# Patient Record
Sex: Male | Born: 1995 | Race: White | Hispanic: No | Marital: Single | Smoking: Never smoker
Health system: Southern US, Community
[De-identification: ages and names within clinical notes are randomized; demographics above are authoritative.]

## PROBLEM LIST (undated history)

## (undated) HISTORY — PX: HERNIA REPAIR: SHX51

---
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
## 2017-07-11 ENCOUNTER — Other Ambulatory Visit: Payer: Self-pay | Admitting: Family Medicine

## 2017-07-11 DIAGNOSIS — N5089 Other specified disorders of the male genital organs: Secondary | ICD-10-CM

## 2017-07-15 ENCOUNTER — Ambulatory Visit
Admission: RE | Admit: 2017-07-15 | Discharge: 2017-07-15 | Disposition: A | Discharge status: Home / Self Care | Payer: PRIVATE HEALTH INSURANCE | Source: Ambulatory Visit | Attending: Family Medicine | Admitting: Family Medicine

## 2017-07-15 ENCOUNTER — Other Ambulatory Visit: Payer: PRIVATE HEALTH INSURANCE

## 2017-07-15 DIAGNOSIS — N5089 Other specified disorders of the male genital organs: Secondary | ICD-10-CM

## 2018-01-03 IMAGING — US US SCROTUM W/ DOPPLER COMPLETE
1 series · 13 of 25 positions shown · non-contrast
Comparison: No recent prior.

CLINICAL DATA: Right testicular lump.

EXAM:
SCROTAL ULTRASOUND
DOPPLER ULTRASOUND OF THE TESTICLES
TECHNIQUE: Complete ultrasound examination of the testicles, epididymis, and
other scrotal structures was performed. Color and spectral Doppler
ultrasound were also utilized to evaluate blood flow to the
testicles.

[Series 1: us scrotum w/ doppler complete · 0.05mm/px · 13 of 68 slices shown]
[im 1/68]
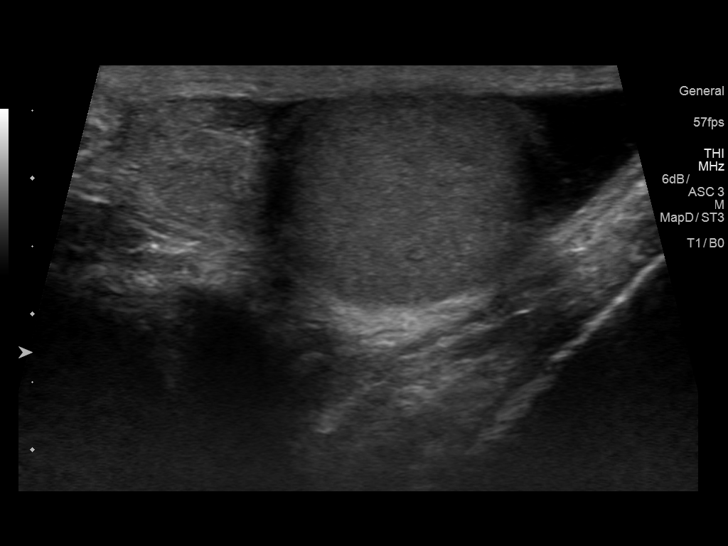
[im 6/68]
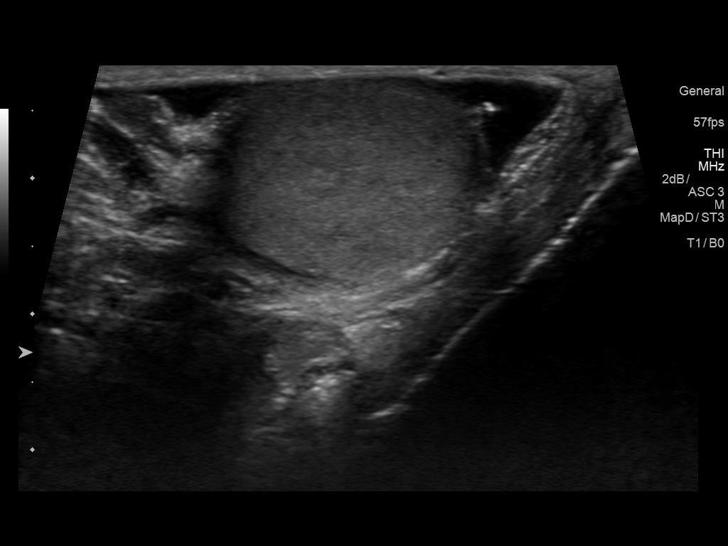
[im 12/68]
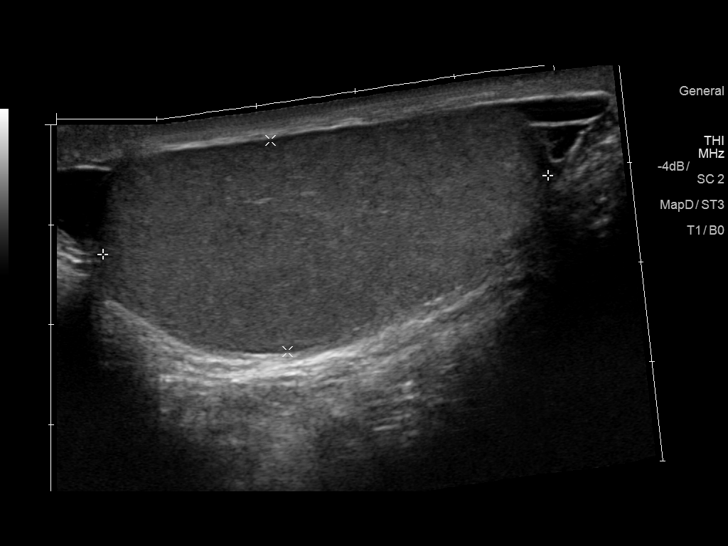
[im 17/68]
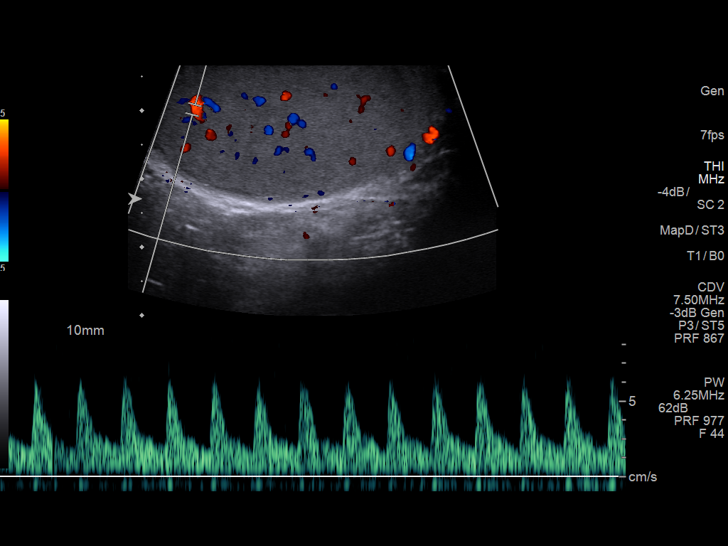
[im 23/68]
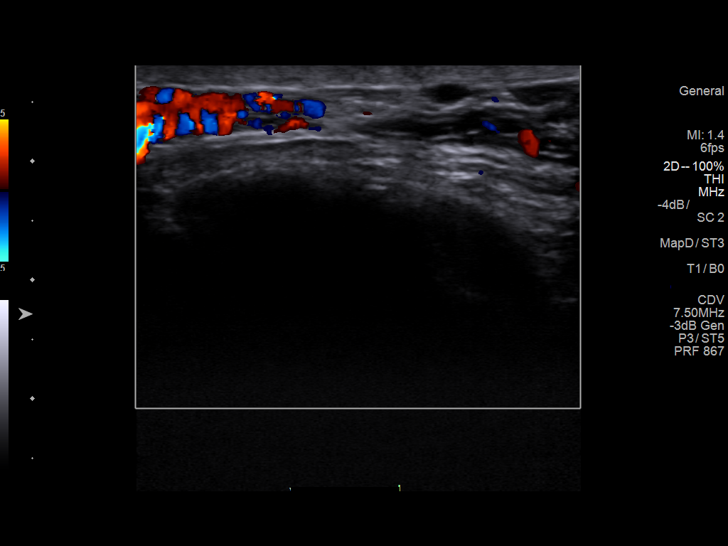
[im 28/68]
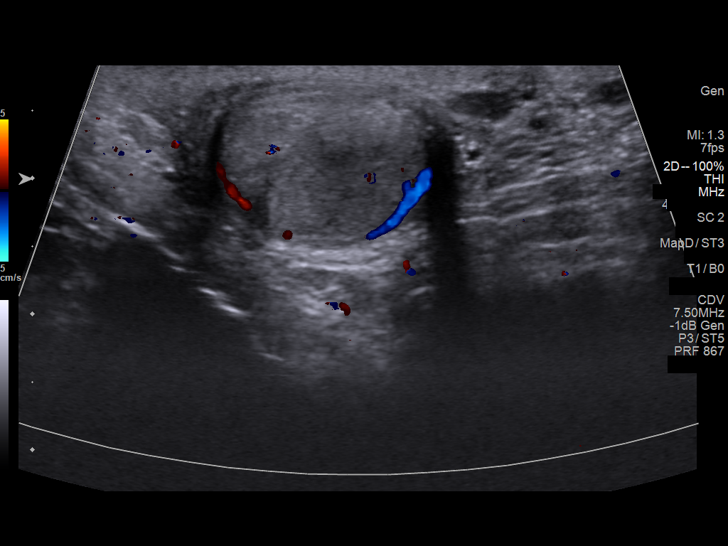
[im 34/68]
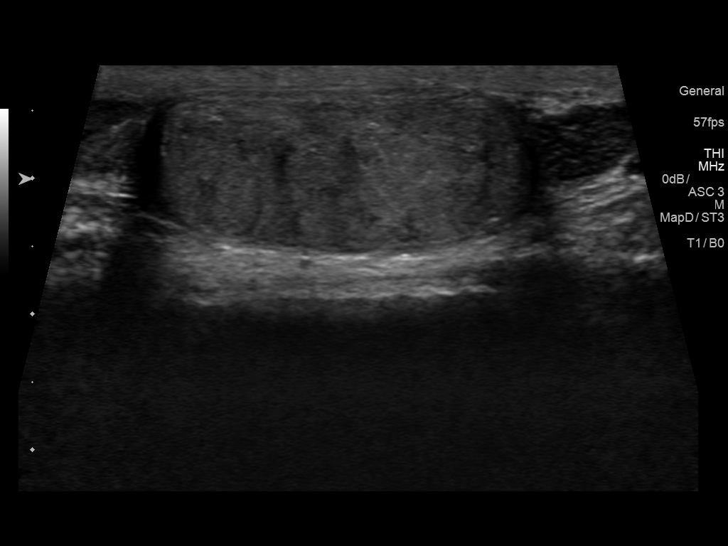
[im 40/68]
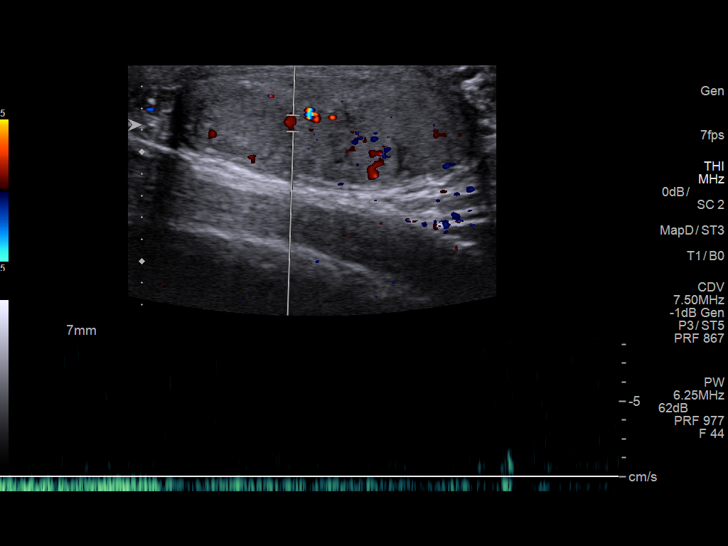
[im 45/68]
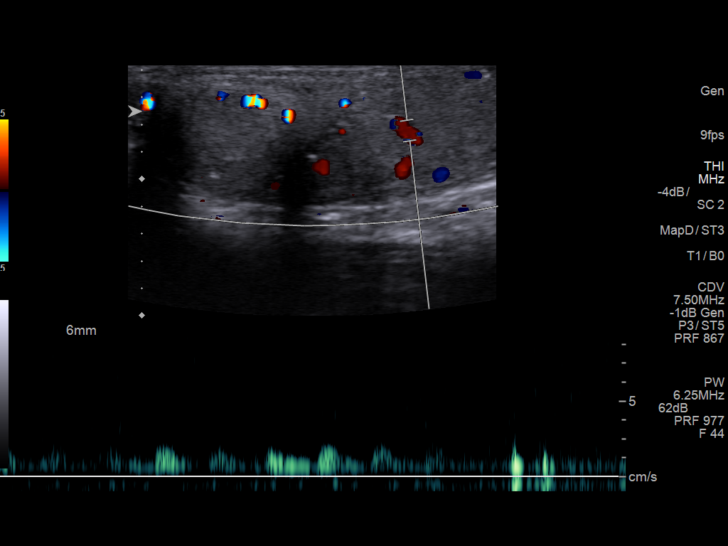
[im 51/68]
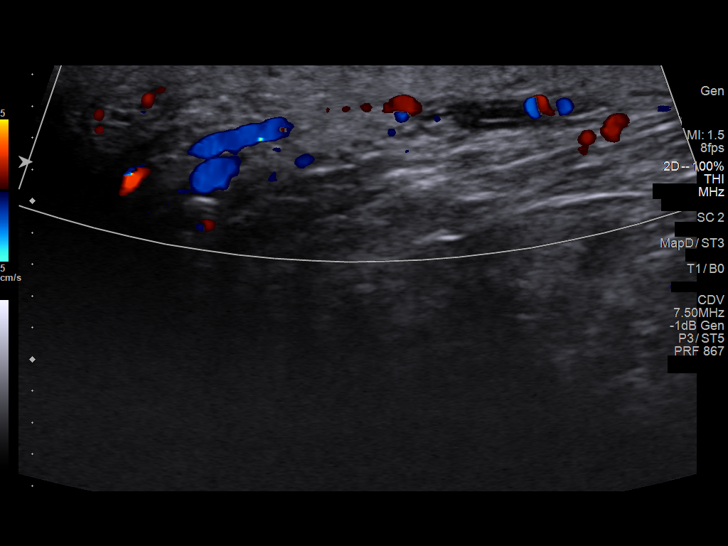
[im 56/68]
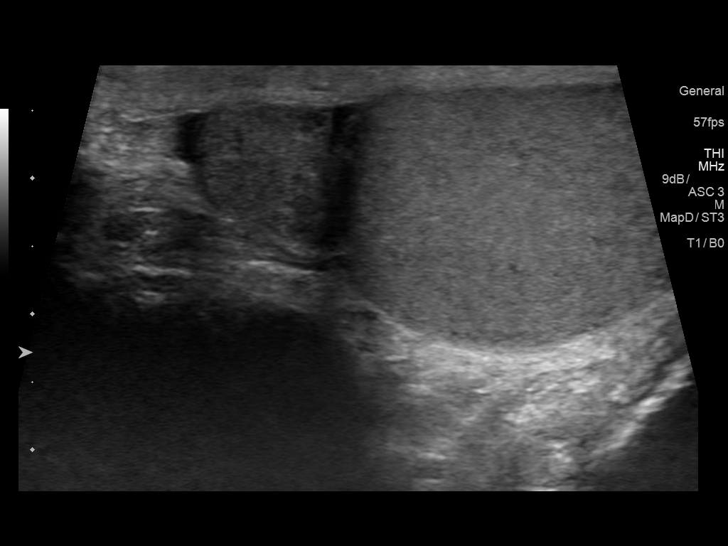
[im 62/68]
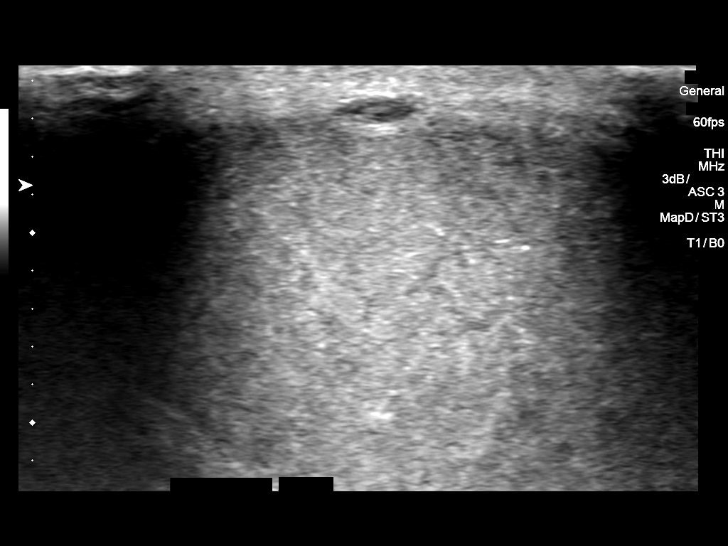
[im 68/68]
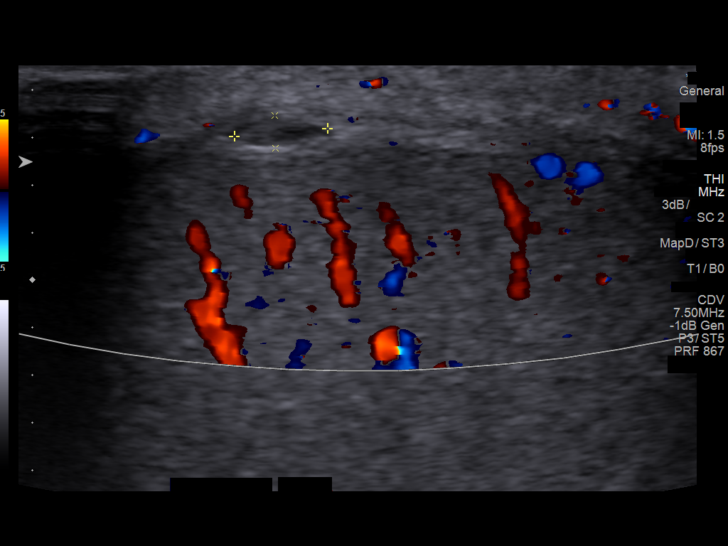

[13 of 25 positions shown; findings below may reference images not displayed]

FINDINGS: Right testicle

Measurements: 4.5 x 2.1 x 3.3 cm. No mass or microlithiasis
visualized. In the region of palpable abnormality is a small 4 mm
hypoechoic focus that appears to be within the scrotum just anterior
to the testicle. Follow-up exam can be obtained to demonstrate
stability and/or resolution.

Left testicle

Measurements: 2.8 x 1.2 x 1.6 cm. No mass or microlithiasis
visualized.

Right epididymis:  Normal in size and appearance.

Left epididymis:  Normal in size and appearance.

Hydrocele:  Small right hydrocele.

Varicocele:  None visualized.

Pulsed Doppler interrogation of both testes demonstrates normal low
resistance arterial and venous waveforms bilaterally.
IMPRESSION: 1. No evidence of testicular mass. Palpable area represents a small
hypoechoic focus and appears to be within the scrotum. Follow-up
exam to demonstrate stability and/or resolution suggested .

2. Small right hydrocele.

3. Left testicular atrophy.

## 2019-07-18 ENCOUNTER — Other Ambulatory Visit: Payer: Self-pay

## 2019-07-18 DIAGNOSIS — Z20822 Contact with and (suspected) exposure to covid-19: Secondary | ICD-10-CM

## 2019-07-19 LAB — NOVEL CORONAVIRUS, NAA: SARS-CoV-2, NAA: NOT DETECTED

## 2019-07-23 ENCOUNTER — Other Ambulatory Visit: Payer: Self-pay

## 2019-07-23 DIAGNOSIS — Z20822 Contact with and (suspected) exposure to covid-19: Secondary | ICD-10-CM

## 2019-07-25 ENCOUNTER — Other Ambulatory Visit: Payer: Self-pay

## 2019-07-25 DIAGNOSIS — Z20822 Contact with and (suspected) exposure to covid-19: Secondary | ICD-10-CM

## 2019-07-25 LAB — NOVEL CORONAVIRUS, NAA: SARS-CoV-2, NAA: NOT DETECTED

## 2019-07-27 LAB — NOVEL CORONAVIRUS, NAA: SARS-CoV-2, NAA: NOT DETECTED

## 2019-07-30 ENCOUNTER — Other Ambulatory Visit: Payer: Self-pay

## 2019-07-30 DIAGNOSIS — U071 COVID-19: Secondary | ICD-10-CM

## 2019-07-31 LAB — NOVEL CORONAVIRUS, NAA: SARS-CoV-2, NAA: DETECTED — AB

## 2019-08-03 ENCOUNTER — Ambulatory Visit (INDEPENDENT_AMBULATORY_CARE_PROVIDER_SITE_OTHER)
Admission: RE | Admit: 2019-08-03 | Discharge: 2019-08-03 | Disposition: A | Discharge status: Home / Self Care | Payer: BC Managed Care – PPO | Source: Ambulatory Visit

## 2019-08-03 DIAGNOSIS — R439 Unspecified disturbances of smell and taste: Secondary | ICD-10-CM

## 2019-08-03 DIAGNOSIS — U071 COVID-19: Secondary | ICD-10-CM

## 2019-08-03 NOTE — ED Provider Notes (Signed)
Virtual Visit via Video Note:  Tristan Poole  initiated request for Telemedicine visit with [facility] team. I connected with Tristan Poole  on 08/03/2019 at 10:24 AM  for a synchronized telemedicine visit using a video enabled HIPPA compliant telemedicine application. I verified that I am speaking with Tristan Poole  using two identifiers. Bristyl Mclees Jodell Cipro, PA-C  was physically located in a [facility] Urgent care site and Tristan Poole was located at a different location.   The limitations of evaluation and management by telemedicine as well as the availability of in-person appointments were discussed. Patient was informed that he  may incur a bill ( including co-pay) for this virtual visit encounter. Tristan Poole  expressed understanding and gave verbal consent to proceed with virtual visit.     History of Present Illness:Tristan Poole  is a 23 y.o. male presents with positive COVID-19 test.  Patient requesting more information on when to end quarantine.  He had his first covered test 07/18/2019 after positive exposure with negative results.  States on 07/20/2019, he hung out with friends, and had second positive COVID exposure.  His was tested again on 07/23/2019 with negative results.  He started having fever of 101, body aches on 07/24/2019.  He had a third COVID test 07/25/2019 with negative results.  States he then developed congestion, loss of taste and smell, with fourth COVID test on 07/30/2019, which came back positive.  States since starting symptoms on 07/24/2019, since symptoms have been improving.  He has not had a fever for the past 4 days.  He denies any shortness of breath.  States he is regaining some smell, but still has loss of taste.  He has been working from home.   No past medical history on file.  No Known Allergies    Observations/Objective: General: Well appearing, nontoxic, no acute distress. Sitting comfortably. Head: Normocephalic, atraumatic Eye: No  conjunctival injection, eyelid swelling. EOMI ENT: Mucus membranes moist, no lip cracking. No obvious nasal drainage. Pulm: Speaking in full sentences without difficulty. Normal effort. No respiratory distress, accessory muscle use. Neuro: Normal mental status. Alert and oriented x 3.   Assessment and Plan: Discussed CDC recommendations discontinue quarantine is 10 days since symptom onset with greater than 72 hours of no fever with antipyretics, and improving symptoms.  Patient will meet this criteria tomorrow.  Will provide work note, can return to work 08/06/2019.  Discussed with patient, COVID test can remain positive up to 1 to 2 months.  Return precautions given.  Patient expresses understanding and agrees to plan.  Follow Up Instructions:    I discussed the assessment and treatment plan with the patient. The patient was provided an opportunity to ask questions and all were answered. The patient agreed with the plan and demonstrated an understanding of the instructions.   The patient was advised to call back or seek an in-person evaluation if the symptoms worsen or if the condition fails to improve as anticipated.  I provided 20 minutes of non-face-to-face time during this encounter.    Ok Edwards, PA-C  08/03/2019 10:24 AM          Ok Edwards, PA-C 08/03/19 1026

## 2019-08-03 NOTE — Discharge Instructions (Signed)
You will be able to end quarantine and return to work 08/06/2019 as long as symptoms are improving and no temperature >100.4 for at least 3 days without tylenol/motrin. Continue to monitor symptoms. If experiencing shortness of breath, go to the ED for further evaluation needed.

## 2020-10-09 DIAGNOSIS — I10 Essential (primary) hypertension: Secondary | ICD-10-CM | POA: Insufficient documentation

## 2021-01-02 ENCOUNTER — Emergency Department (HOSPITAL_COMMUNITY): Payer: BC Managed Care – PPO

## 2021-01-02 ENCOUNTER — Other Ambulatory Visit: Payer: Self-pay

## 2021-01-02 ENCOUNTER — Encounter (HOSPITAL_COMMUNITY): Payer: Self-pay | Admitting: Emergency Medicine

## 2021-01-02 ENCOUNTER — Emergency Department (HOSPITAL_COMMUNITY)
Admission: EM | Admit: 2021-01-02 | Discharge: 2021-01-03 | Disposition: A | Discharge status: Home / Self Care | Payer: BC Managed Care – PPO | Attending: Emergency Medicine | Admitting: Emergency Medicine

## 2021-01-02 DIAGNOSIS — F419 Anxiety disorder, unspecified: Secondary | ICD-10-CM | POA: Diagnosis not present

## 2021-01-02 DIAGNOSIS — R000 Tachycardia, unspecified: Secondary | ICD-10-CM | POA: Insufficient documentation

## 2021-01-02 DIAGNOSIS — R002 Palpitations: Secondary | ICD-10-CM | POA: Insufficient documentation

## 2021-01-02 LAB — CBC
HCT: 45.1 % (ref 39.0–52.0)
Hemoglobin: 16 g/dL (ref 13.0–17.0)
MCH: 31.3 pg (ref 26.0–34.0)
MCHC: 35.5 g/dL (ref 30.0–36.0)
MCV: 88.1 fL (ref 80.0–100.0)
Platelets: 255 10*3/uL (ref 150–400)
RBC: 5.12 MIL/uL (ref 4.22–5.81)
RDW: 11.8 % (ref 11.5–15.5)
WBC: 8 10*3/uL (ref 4.0–10.5)
nRBC: 0 % (ref 0.0–0.2)

## 2021-01-02 LAB — BASIC METABOLIC PANEL
Anion gap: 13 (ref 5–15)
BUN: 15 mg/dL (ref 6–20)
CO2: 22 mmol/L (ref 22–32)
Calcium: 9.7 mg/dL (ref 8.9–10.3)
Chloride: 102 mmol/L (ref 98–111)
Creatinine, Ser: 0.96 mg/dL (ref 0.61–1.24)
GFR, Estimated: >60 mL/min (ref >60)
Glucose, Bld: 109 mg/dL — ABNORMAL HIGH (ref 70–99)
Potassium: 3.3 mmol/L — ABNORMAL LOW (ref 3.5–5.1)
Sodium: 137 mmol/L (ref 135–145)

## 2021-01-02 LAB — TROPONIN I (HIGH SENSITIVITY)
Troponin I (High Sensitivity): <2 ng/L (ref <18)
Troponin I (High Sensitivity): <2 ng/L (ref <18)

## 2021-01-02 NOTE — ED Triage Notes (Signed)
Patient here with palpitations, states that he has been having them for awhile, has had Holter monitors, stress tests and nothing has been found.  Patient having some anxiety due to the palpitations.  Patient denies any chest pain.  No nausea or vomiting.

## 2021-01-03 MED ORDER — LORAZEPAM 1 MG PO TABS
1.0000 mg | ORAL_TABLET | Freq: Once | ORAL | Status: AC
  Administered 2021-01-03: 1 mg via ORAL
  Filled 2021-01-03: qty 1

## 2021-01-03 MED ORDER — PROPRANOLOL HCL 10 MG PO TABS
10.0000 mg | ORAL_TABLET | Freq: Once | ORAL | Status: AC
  Administered 2021-01-03: 10 mg via ORAL
  Filled 2021-01-03: qty 1

## 2021-01-03 MED ORDER — PROPRANOLOL HCL 10 MG PO TABS: 10.0000 mg | ORAL_TABLET | Freq: Two times a day (BID) | ORAL | 0 refills | Status: DC | PRN

## 2021-01-03 MED ORDER — POTASSIUM CHLORIDE CRYS ER 20 MEQ PO TBCR
40.0000 meq | EXTENDED_RELEASE_TABLET | Freq: Once | ORAL | Status: AC
  Administered 2021-01-03: 40 meq via ORAL
  Filled 2021-01-03: qty 2

## 2021-01-03 NOTE — ED Provider Notes (Signed)
[facility] EMERGENCY DEPARTMENT Provider Note   CSN: [id number] Arrival date & time: 01/02/21  1953     History Chief Complaint  Patient presents with  . Palpitations    Tristan Poole is a 25 y.o. male.  25 year old male here with recurrent episode of palpitations.  Patient states he has multiple times in the past.  He has had multiple Holter monitors and cardiology/PCP visits to evaluate the same.  Patient states that all of these have been to no avail.  He states he does use vape pens and has nicotine in them is trying to quit.  He also notices that symptoms can happen more commonly when he is hung over or dehydrated.  He states that he has anxiety around episodes and is going to the point where the anxiety happens any time he thinks about it.  States he feels a flutter without any chest pain or shortness of breath and goes off.  No illicit drug use.   Palpitations      No past medical history on file.  There are no problems to display for this patient.   Past Surgical History:  Procedure Laterality Date  . HERNIA REPAIR         History reviewed. No pertinent family history.  Social History   Tobacco Use  . Smoking status: Never Smoker  . Smokeless tobacco: Never Used  Substance Use Topics  . Alcohol use: Yes    Comment: once a month  . Drug use: No    Home Medications Prior to Admission medications   Medication Sig Start Date End Date Taking? Authorizing Provider  amLODipine (NORVASC) 10 MG tablet Take 10 mg by mouth daily. 12/09/20  Yes [provider]  Ascorbic Acid (VITAMIN C) 1000 MG tablet Take 1,000 mg by mouth daily.   Yes [provider]  pantoprazole (PROTONIX) 40 MG tablet Take 40 mg by mouth daily. 12/03/20  Yes [provider]  propranolol (INDERAL) 10 MG tablet Take 1 tablet (10 mg total) by mouth 2 (two) times daily as needed. 01/03/21  Yes Tarshia Kot, Barbara Cower, MD    Allergies    Patient has no known  allergies.  Review of Systems   Review of Systems  Cardiovascular: Positive for palpitations.  All other systems reviewed and are negative.   Physical Exam Updated Vital Signs BP 123/71   Pulse (!) 119   Temp 97.7 F (36.5 C) (Oral)   Resp 20   SpO2 96%   Physical Exam Vitals and nursing note reviewed.  Constitutional:      Appearance: He is well-developed and well-nourished.  HENT:     Head: Normocephalic and atraumatic.     Nose: Nose normal. No congestion or rhinorrhea.     Mouth/Throat:     Mouth: Mucous membranes are moist.     Pharynx: Oropharynx is clear.  Eyes:     Pupils: Pupils are equal, round, and reactive to light.  Cardiovascular:     Rate and Rhythm: Tachycardia present.     Comments: It appears that the patient's heart rate is in the low 100s but as I talked to him about his palpitations and his anxiety his heart rate gets as high as 138.  As he takes some deep breaths and relaxes his heart rate improved to the low 100s again.  It is regular. Pulmonary:     Effort: Pulmonary effort is normal. No respiratory distress.  Abdominal:     General: Abdomen is  flat. There is no distension.  Musculoskeletal:        General: Normal range of motion.     Cervical back: Normal range of motion.  Skin:    General: Skin is warm and dry.  Neurological:     General: No focal deficit present.     Mental Status: He is alert.     ED Results / Procedures / Treatments   Labs (all labs ordered are listed, but only abnormal results are displayed) Labs Reviewed  BASIC METABOLIC PANEL - Abnormal; Notable for the following components:      Result Value   Potassium 3.3 (*)    Glucose, Bld 109 (*)    All other components within normal limits  CBC  TROPONIN I (HIGH SENSITIVITY)  TROPONIN I (HIGH SENSITIVITY)    EKG EKG Interpretation  Date/Time:  Friday January 02 2021 20:10:09 EST Ventricular Rate:  126 PR Interval:  158 QRS Duration: 96 QT Interval:  312 QTC  Calculation: 451 R Axis:   92 Text Interpretation: Sinus tachycardia Rightward axis Septal infarct , age undetermined ST & T wave abnormality, consider inferior ischemia Abnormal ECG No old tracing to compare Confirmed by Marily Memos [phone]) on 01/03/2021 12:43:31 AM   EKG Interpretation  Date/Time:  Saturday January 03 2021 01:31:10 EST Ventricular Rate:  131 PR Interval:  158 QRS Duration: 102 QT Interval:  295 QTC Calculation: 436 R Axis:   90 Text Interpretation: 0Sinus tachycardia Borderline right axis deviation Nonspecific repol abnormality, diffuse leads Confirmed by Marily Memos [id number]) on 01/03/2021 1:59:41 AM        Radiology DG Chest 2 View  Result Date: 01/02/2021 CLINICAL DATA:  Tachycardia and heart palpitations. EXAM: CHEST - 2 VIEW COMPARISON:  12/30/2011 FINDINGS: The cardiomediastinal contours are normal. The lungs are clear. Pulmonary vasculature is normal. No consolidation, pleural effusion, or pneumothorax. No acute osseous abnormalities are seen. IMPRESSION: Negative radiographs of the chest. Electronically Signed   By: Narda Rutherford M.D.   On: 01/02/2021 20:31    Procedures Procedures   Medications Ordered in ED Medications  LORazepam (ATIVAN) tablet 1 mg (1 mg Oral Given 01/03/21 0302)  propranolol (INDERAL) tablet 10 mg (10 mg Oral Given 01/03/21 0302)  potassium chloride SA (KLOR-CON) CR tablet 40 mEq (40 mEq Oral Given 01/03/21 0302)    ED Course  I have reviewed the triage vital signs and the nursing notes.  Pertinent labs & imaging results that were available during my care of the patient were reviewed by me and considered in my medical decision making (see chart for details).    MDM Rules/Calculators/A&P                         Slightly low K but not likely related. Sinus tach on monitor. Sure sounds like anxiety especially in the setting of multiple cardiac workups and consultations with relatively normal labs here. Will give ativan and initiate PRN  propranolol for ysmptoms. Cardiology follow up again on the 10th already scheduled.  Significant improvement with medications here. Will rx for PRN propranolol pending cardiology follow up.   Final Clinical Impression(s) / ED Diagnoses Final diagnoses:  Palpitations  Anxiety    Rx / DC Orders ED Discharge Orders         Ordered    propranolol (INDERAL) 10 MG tablet  2 times daily PRN        01/03/21 0416  Rudell Ortman, Barbara Cower, MD 01/03/21 [phone]

## 2021-01-08 DIAGNOSIS — F419 Anxiety disorder, unspecified: Secondary | ICD-10-CM | POA: Insufficient documentation

## 2021-04-01 ENCOUNTER — Encounter (HOSPITAL_COMMUNITY): Payer: Self-pay

## 2021-04-01 ENCOUNTER — Other Ambulatory Visit: Payer: Self-pay

## 2021-04-01 ENCOUNTER — Ambulatory Visit (HOSPITAL_COMMUNITY)
Admission: EM | Admit: 2021-04-01 | Discharge: 2021-04-01 | Disposition: A | Discharge status: Home / Self Care | Payer: BC Managed Care – PPO | Attending: Family Medicine | Admitting: Family Medicine

## 2021-04-01 DIAGNOSIS — M25561 Pain in right knee: Secondary | ICD-10-CM | POA: Diagnosis not present

## 2021-04-01 DIAGNOSIS — S81011A Laceration without foreign body, right knee, initial encounter: Secondary | ICD-10-CM | POA: Diagnosis not present

## 2021-04-01 NOTE — ED Triage Notes (Signed)
Pt in with c/o right knee injury that occurred when he mis stepped and fell on the tailgate of his truck

## 2021-04-04 NOTE — ED Provider Notes (Signed)
  [facility] CARE CENTER   [id number] 04/01/21 Arrival Time: 1743  ASSESSMENT & PLAN:  1. Acute pain of right knee   2. Laceration of right knee, initial encounter    Procedure: Verbal consent obtained. Patient provided with risks and alternatives to the procedure. Wound copiously irrigated with NS then cleansed with betadine. Local anesthesia: Lidocaine 1% without epinephrine. Wound carefully explored. No foreign body, tendon injury, or nonviable tissue were noted. Using sterile technique, 2 interrupted 4-0 Prolene sutures were placed in addition to 2 vertical mattress 4-0 Prolene sutures to reapproximate the wound. Procedure tolerated well. No complications. Minimal bleeding. Advised to look for and return for any signs of infection such as redness, swelling, discharge, or worsening pain. Return for suture removal in 7-10 days.  See AVS for discharge information.  Reviewed expectations re: course of current medical issues. Questions answered. Outlined signs and symptoms indicating need for more acute intervention. Patient verbalized understanding. After Visit Summary given.   SUBJECTIVE:  Tristan Poole is a 25 y.o. male who presents with a laceration of his R knee; hit on truck tailgate. Mod bleeding; controlled. Able to bear weight with minimal discomfort.  Td UTD: Yes.  ROS: As per HPI. Health Maintenance Due  Topic Date Due  . COVID-19 Vaccine (1) Never done  . HPV VACCINES (1 - Male 2-dose series) Never done  . HIV Screening  Never done  . Hepatitis C Screening  Never done  . TETANUS/TDAP  Never done    OBJECTIVE:  Vitals:   04/01/21 1809  BP: (!) 153/84  Pulse: (!) 109  Resp: 18  Temp: 98.7 F (37.1 C)  TempSrc: Oral  SpO2: 97%    Slight tachycardia noted; reports anxiety. General appearance: alert; no distress Skin: linear laceration of R anterior knee; size: approx 1.5 cm; clean wound edges, no foreign bodies; without active bleeding Psychological: alert  and cooperative; normal mood and affect   Labs Reviewed - No data to display  No results found.  No Known Allergies  History reviewed. No pertinent past medical history. Social History   Socioeconomic History  . Marital status: Single    Spouse name: Not on file  . Number of children: Not on file  . Years of education: Not on file  . Highest education level: Not on file  Occupational History  . Not on file  Tobacco Use  . Smoking status: Never Smoker  . Smokeless tobacco: Never Used  Substance and Sexual Activity  . Alcohol use: Yes    Comment: once a month  . Drug use: No  . Sexual activity: Never  Other Topics Concern  . Not on file  Social History Narrative  . Not on file   Social Determinants of Health   Financial Resource Strain: Not on file  Food Insecurity: Not on file  Transportation Needs: Not on file  Physical Activity: Not on file  Stress: Not on file  Social Connections: Not on file         Mardella Layman, MD 04/04/21 1333

## 2022-10-27 IMAGING — DX DG CHEST 2V
2 series · 2 of 2 positions shown · non-contrast
Comparison: 12/30/2011

CLINICAL DATA: Tachycardia and heart palpitations.

EXAM:
CHEST - 2 VIEW

[w chest pa]
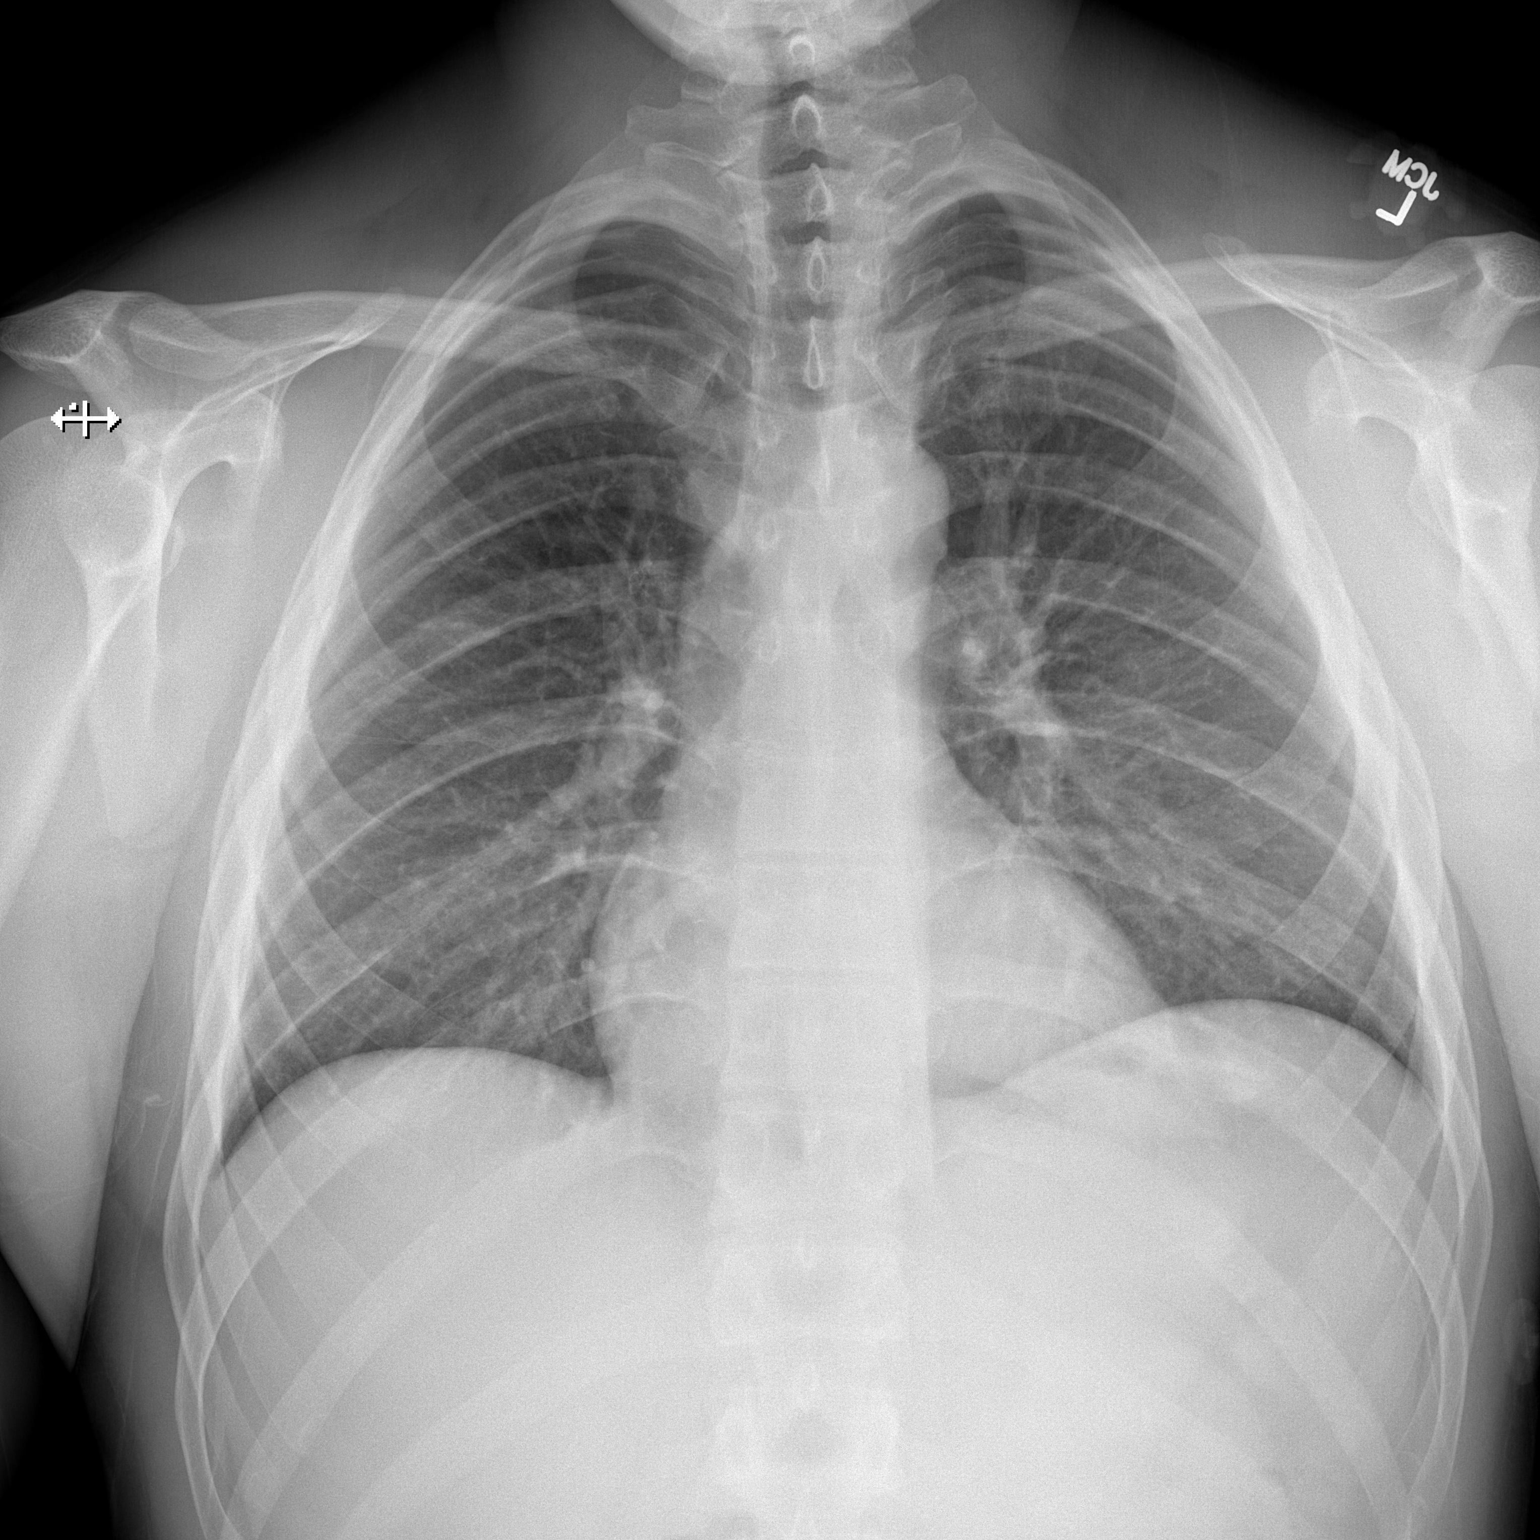

[w chest lat]
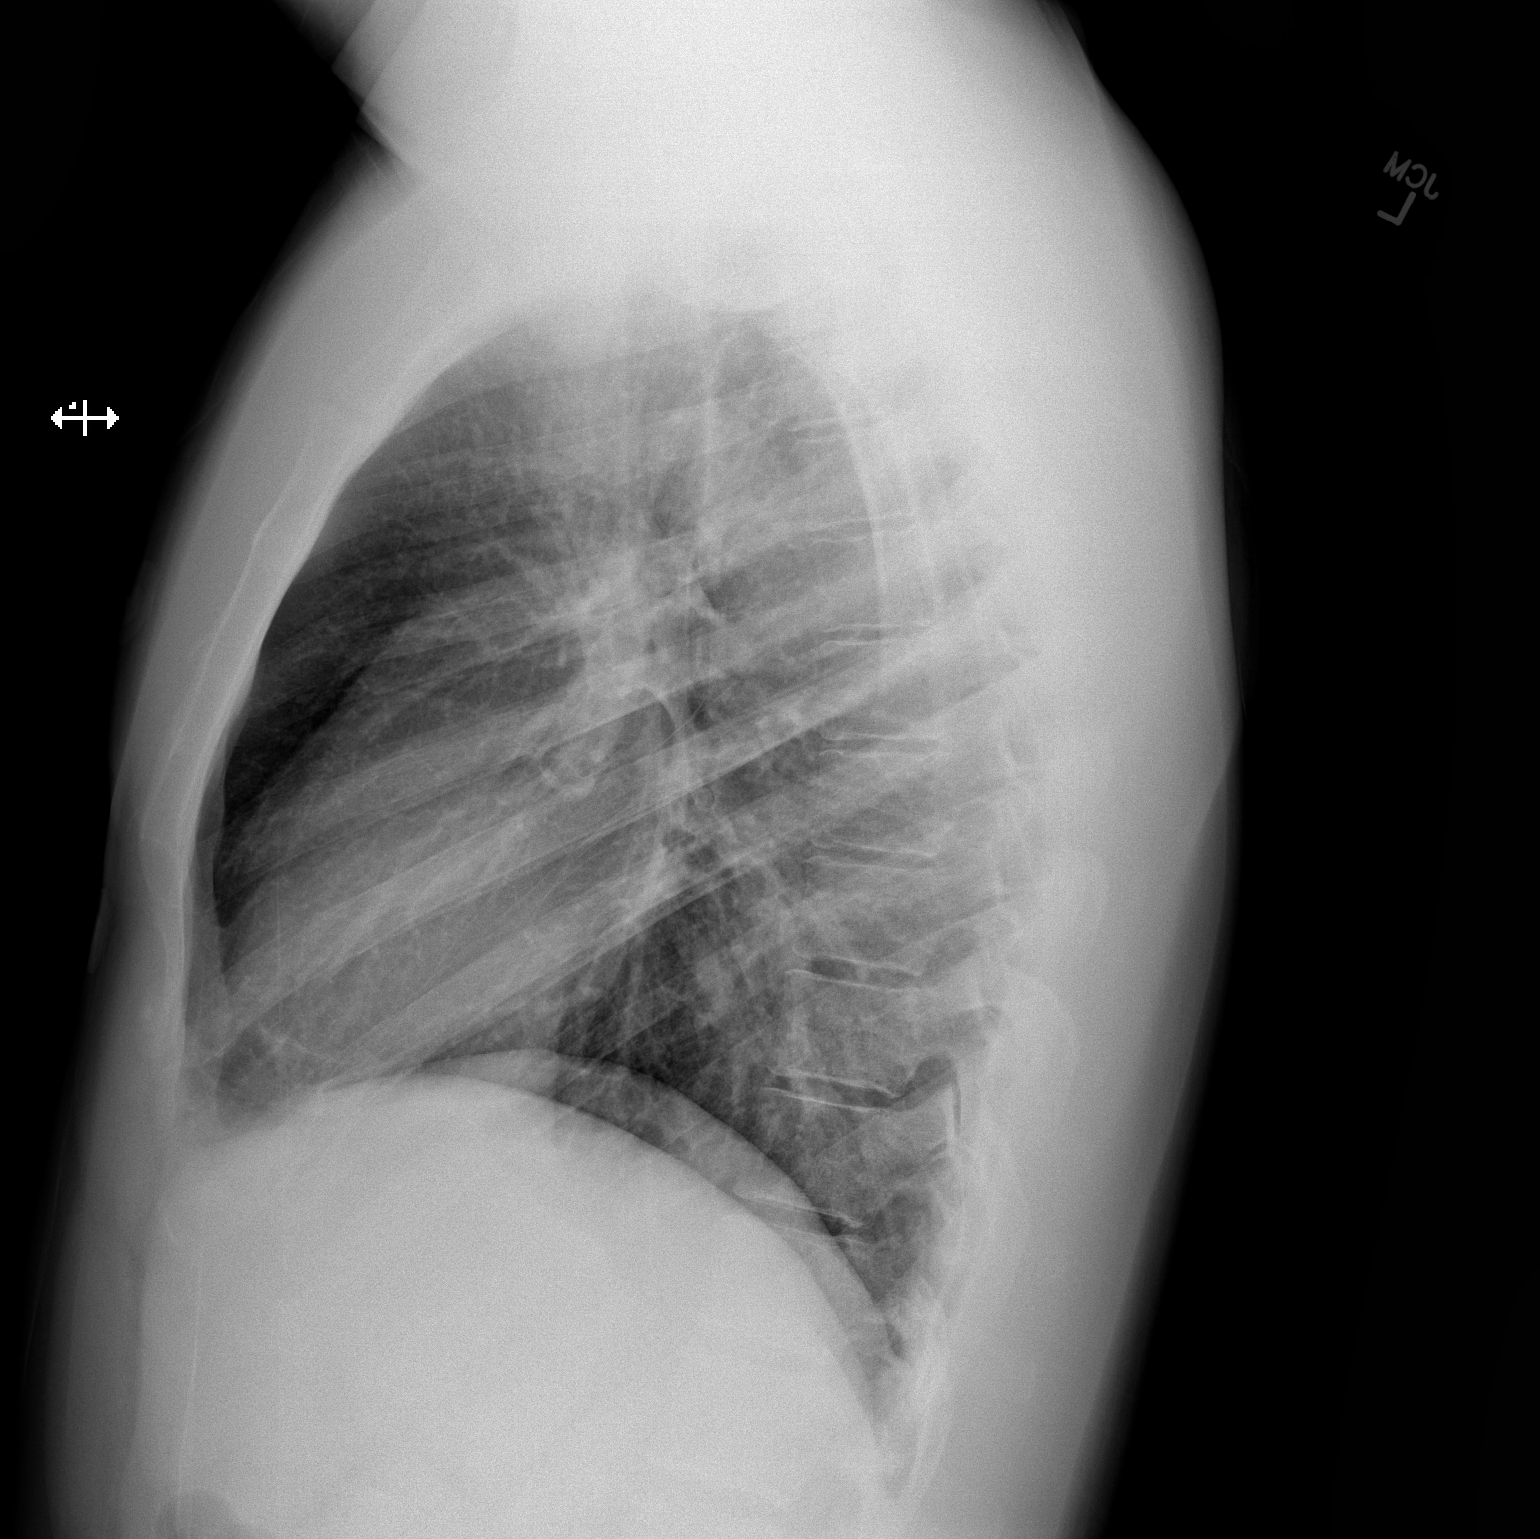

[2 of 2 positions shown; findings below may reference images not displayed]

FINDINGS: The cardiomediastinal contours are normal. The lungs are clear.
Pulmonary vasculature is normal. No consolidation, pleural effusion,
or pneumothorax. No acute osseous abnormalities are seen.
IMPRESSION: Negative radiographs of the chest.

## 2022-12-06 DIAGNOSIS — R002 Palpitations: Secondary | ICD-10-CM | POA: Diagnosis not present

## 2022-12-06 DIAGNOSIS — Z23 Encounter for immunization: Secondary | ICD-10-CM | POA: Diagnosis not present

## 2022-12-06 DIAGNOSIS — F411 Generalized anxiety disorder: Secondary | ICD-10-CM | POA: Diagnosis not present

## 2022-12-06 DIAGNOSIS — R6882 Decreased libido: Secondary | ICD-10-CM | POA: Diagnosis not present

## 2023-08-11 DIAGNOSIS — F411 Generalized anxiety disorder: Secondary | ICD-10-CM | POA: Diagnosis not present

## 2023-08-11 DIAGNOSIS — R002 Palpitations: Secondary | ICD-10-CM | POA: Diagnosis not present

## 2023-08-11 DIAGNOSIS — I10 Essential (primary) hypertension: Secondary | ICD-10-CM | POA: Diagnosis not present

## 2023-08-11 DIAGNOSIS — K219 Gastro-esophageal reflux disease without esophagitis: Secondary | ICD-10-CM | POA: Diagnosis not present

## 2024-02-09 DIAGNOSIS — Z114 Encounter for screening for human immunodeficiency virus [HIV]: Secondary | ICD-10-CM | POA: Diagnosis not present

## 2024-02-09 DIAGNOSIS — R002 Palpitations: Secondary | ICD-10-CM | POA: Diagnosis not present

## 2024-02-09 DIAGNOSIS — R7301 Impaired fasting glucose: Secondary | ICD-10-CM | POA: Diagnosis not present

## 2024-02-09 DIAGNOSIS — Z0000 Encounter for general adult medical examination without abnormal findings: Secondary | ICD-10-CM | POA: Diagnosis not present

## 2024-02-09 DIAGNOSIS — K219 Gastro-esophageal reflux disease without esophagitis: Secondary | ICD-10-CM | POA: Diagnosis not present

## 2024-02-09 DIAGNOSIS — Z1159 Encounter for screening for other viral diseases: Secondary | ICD-10-CM | POA: Diagnosis not present

## 2024-04-12 ENCOUNTER — Ambulatory Visit: Admitting: Student in an Organized Health Care Education/Training Program

## 2024-04-12 ENCOUNTER — Encounter: Payer: Self-pay | Admitting: Student in an Organized Health Care Education/Training Program

## 2024-04-12 VITALS — BP 153/89 | HR 142 | Ht 72.0 in | Wt 188.0 lb

## 2024-04-12 DIAGNOSIS — I479 Paroxysmal tachycardia, unspecified: Secondary | ICD-10-CM | POA: Insufficient documentation

## 2024-04-12 DIAGNOSIS — I10 Essential (primary) hypertension: Secondary | ICD-10-CM

## 2024-04-12 DIAGNOSIS — F419 Anxiety disorder, unspecified: Secondary | ICD-10-CM | POA: Diagnosis not present

## 2024-04-12 MED ORDER — PROPRANOLOL HCL 10 MG PO TABS: 10.0000 mg | ORAL_TABLET | Freq: Two times a day (BID) | ORAL | 2 refills | Status: DC | PRN

## 2024-04-12 NOTE — Assessment & Plan Note (Signed)
 Chronic intermittent issue over the last 4-5 years.  Still having occasional sensation of palpitations and feelings of anxiety that come up a few times a week.  This has been pretty extensively worked up at [facility] with 2 ambulatory heart monitors which only showed episodes of sinus tachycardia, and echocardiogram which was reportedly normal, and a exercise test presumably to evaluate for inappropriate sinus tachycardia.  He has tried various medication management including medicines for antianxiety as well as long-acting propranolol .  The propranolol  seem to be very helpful for him but he discontinued this about a week ago.  Here for second opinion, hoping to find an underlying etiology for this that can be treated so that he does not have to continue chronic medications.  His exam today is reassuring.  He is a very healthy young man with a high stress job and a busy family life right now.  I am going to request records from [facility] with last laboratory work.  Only additional differential diagnosis that I can entertaining is a possible pheochromocytoma, we talked about how this is rare.  If serum metanephrines has not been checked in the past, I will evaluate those.  I recommended having propranolol  as needed, and recommended a lower dose of 10 mg IR as needed.  Patient is frustrated that the prior heart monitors did not pick up an arrhythmia.  I recommended that he use a wearable device like an Apple Watch or Garmin that trend heart rates and detect rhythms like atrial fibrillation pretty well.  Follow-up with me in 4 weeks.

## 2024-04-12 NOTE — Progress Notes (Signed)
 New Patient Office Visit  Subjective    Patient ID: Tristan Poole, male    DOB: 04-12-1996  Age: 28 y.o. MRN: [id number]  CC:  Chief Complaint  Patient presents with   Establish Care    Would like to discuss things from the past few years     HPI  Tristan Poole presents to establish care   28 year old person here for evaluation of palpitations.  This is an issue that the patient has been dealing with pretty much ever since he graduated college.  His prior physician is with [facility], he has been a little challenging to visit their office in [facility], he lives in [place] now.  He has had evaluations with cardiology, has worn two ambulatory heart monitors which showed only sinus tachycardia.  He had an echocardiogram which was normal.  He has had an exercise stress test that was normal.  He is very active in his lifestyle.  He works out every day.  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  He is married for 5 years, has a 35-year-old child, considering having another child.  Good family support.  He often does well through the day but then will feel sensation of racing heartbeat and anxiety that occur in the evening time.  Very strange to him that sometimes it happens while he is resting, on the couch, or laying on his left side in bed.  No tobacco use, rare alcohol use, no caffeine use.  He reports good sleep quality.  Takes a few supplements including vitamin C, vitamin D, creatine, but no other enhancing medications.  No preworkout.    Outpatient Encounter Medications as of 04/12/2024  Medication Sig   pantoprazole (PROTONIX) 40 MG tablet Take 40 mg by mouth daily.   [DISCONTINUED] Ascorbic Acid (VITAMIN C) 1000 MG tablet Take 1,000 mg by mouth daily.   propranolol  (INDERAL ) 10 MG tablet Take 1 tablet (10 mg total) by mouth 2 (two) times daily as needed (palpitations).   [DISCONTINUED] amLODipine (NORVASC) 10 MG tablet Take 10 mg by mouth daily. (Patient  not taking: Reported on 04/12/2024)   [DISCONTINUED] propranolol  (INDERAL ) 10 MG tablet Take 1 tablet (10 mg total) by mouth 2 (two) times daily as needed. (Patient not taking: Reported on 04/12/2024)   [DISCONTINUED] sertraline (ZOLOFT) 25 MG tablet Take 1 tablet by mouth daily. (Patient not taking: Reported on 04/12/2024)   No facility-administered encounter medications on file as of 04/12/2024.    Past Surgical History:  Procedure Laterality Date   HERNIA REPAIR      Family History  Problem Relation Age of Onset   Hypertension Father        Objective    BP (!) 153/89   Pulse (!) 142   Ht 6' (1.829 m)   Wt 188 lb (85.3 kg)   BMI 25.50 kg/m   Physical Exam  Gen: Well-appearing Eyes: Normal Ears: Normal bilateral tympanic membranes Neck: Normal thyroid, no nodules or adenopathy Heart: Regular, 2 out of 6 early systolic murmur best heard at the left lower sternal border, no diastolic murmur Lungs: Unlabored, clear throughout, no crackles Abdomen is soft and nontender, no organomegaly Ext: No lower extremity edema, warm well-perfused, Neuro: alert, conversational, full strength upper and lower extremities, normal gait Psych: Appropriate mood and affect, not anxious or depressed appearing   Assessment & Plan:   Problem List Items Addressed This Visit       High  Tachycardia, paroxysmal (HCC) - Primary (Chronic)   Chronic intermittent issue over the last 4-5 years.  Still having occasional sensation of palpitations and feelings of anxiety that come up a few times a week.  This has been pretty extensively worked up at [facility] with 2 ambulatory heart monitors which only showed episodes of sinus tachycardia, and echocardiogram which was reportedly normal, and a exercise test presumably to evaluate for inappropriate sinus tachycardia.  He has tried various medication management including medicines for antianxiety as well as long-acting propranolol .  The propranolol  seem to be  very helpful for him but he discontinued this about a week ago.  Here for second opinion, hoping to find an underlying etiology for this that can be treated so that he does not have to continue chronic medications.  His exam today is reassuring.  He is a very healthy young man with a high stress job and a busy family life right now.  I am going to request records from [facility] with last laboratory work.  Only additional differential diagnosis that I can entertaining is a possible pheochromocytoma, we talked about how this is rare.  If serum metanephrines has not been checked in the past, I will evaluate those.  I recommended having propranolol  as needed, and recommended a lower dose of 10 mg IR as needed.  Patient is frustrated that the prior heart monitors did not pick up an arrhythmia.  I recommended that he use a wearable device like an Apple Watch or Garmin that trend heart rates and detect rhythms like atrial fibrillation pretty well.  Follow-up with me in 4 weeks.      Relevant Medications   propranolol  (INDERAL ) 10 MG tablet     Medium    Anxiety disorder (Chronic)   Chronic issue of mild to moderate anxiety.  Probably contributing some amount to his sensation of palpitations, and has a history of panic attacks.  Has tried sertraline in the past but had unpleasant side effects and not interested in retrying another SSRI.  He is going to see a counselor soon, I think cognitive behavioral therapy will be very helpful for him.  He is also making some changes to his work life that might help with his stress levels.      Essential hypertension (Chronic)   Blood pressure elevated today in the clinic.  Patient reports a history of whitecoat hypertension.  I asked him to check blood pressures at home, he has an electronic upper arm cuff.  Will review blood pressure log at next visit in 4 weeks.      Relevant Medications   propranolol  (INDERAL ) 10 MG tablet    Return in about 4 weeks (around  05/10/2024).   Ether Hercules, MD

## 2024-04-12 NOTE — Assessment & Plan Note (Signed)
 Chronic issue of mild to moderate anxiety.  Probably contributing some amount to his sensation of palpitations, and has a history of panic attacks.  Has tried sertraline in the past but had unpleasant side effects and not interested in retrying another SSRI.  He is going to see a counselor soon, I think cognitive behavioral therapy will be very helpful for him.  He is also making some changes to his work life that might help with his stress levels.

## 2024-04-12 NOTE — Assessment & Plan Note (Signed)
 Blood pressure elevated today in the clinic.  Patient reports a history of whitecoat hypertension.  I asked him to check blood pressures at home, he has an electronic upper arm cuff.  Will review blood pressure log at next visit in 4 weeks.

## 2024-05-17 DIAGNOSIS — K219 Gastro-esophageal reflux disease without esophagitis: Secondary | ICD-10-CM | POA: Diagnosis not present

## 2024-05-29 DIAGNOSIS — K219 Gastro-esophageal reflux disease without esophagitis: Secondary | ICD-10-CM | POA: Diagnosis not present

## 2024-05-29 DIAGNOSIS — I10 Essential (primary) hypertension: Secondary | ICD-10-CM | POA: Diagnosis not present

## 2024-09-26 DIAGNOSIS — R070 Pain in throat: Secondary | ICD-10-CM | POA: Diagnosis not present

## 2024-09-26 DIAGNOSIS — I10 Essential (primary) hypertension: Secondary | ICD-10-CM | POA: Diagnosis not present

## 2024-10-03 ENCOUNTER — Ambulatory Visit: Admitting: Student in an Organized Health Care Education/Training Program

## 2024-10-03 ENCOUNTER — Encounter: Payer: Self-pay | Admitting: Student in an Organized Health Care Education/Training Program

## 2024-10-03 VITALS — BP 169/96 | HR 78 | Wt 197.0 lb

## 2024-10-03 DIAGNOSIS — L649 Androgenic alopecia, unspecified: Secondary | ICD-10-CM | POA: Diagnosis not present

## 2024-10-03 DIAGNOSIS — I479 Paroxysmal tachycardia, unspecified: Secondary | ICD-10-CM

## 2024-10-03 DIAGNOSIS — I10 Essential (primary) hypertension: Secondary | ICD-10-CM

## 2024-10-03 DIAGNOSIS — F419 Anxiety disorder, unspecified: Secondary | ICD-10-CM | POA: Diagnosis not present

## 2024-10-03 DIAGNOSIS — Z131 Encounter for screening for diabetes mellitus: Secondary | ICD-10-CM

## 2024-10-03 DIAGNOSIS — Z13220 Encounter for screening for lipoid disorders: Secondary | ICD-10-CM | POA: Diagnosis not present

## 2024-10-03 LAB — LIPID PANEL
Cholesterol: 152 mg/dL (ref 0–200)
HDL: 49.9 mg/dL (ref >39.00)
LDL Cholesterol: 91 mg/dL (ref 0–99)
NonHDL: 101.68
Total CHOL/HDL Ratio: 3
Triglycerides: 53 mg/dL (ref 0.0–149.0)
VLDL: 10.6 mg/dL (ref 0.0–40.0)

## 2024-10-03 LAB — BASIC METABOLIC PANEL WITH GFR
BUN: 15 mg/dL (ref 6–23)
CO2: 29 meq/L (ref 19–32)
Calcium: 9.8 mg/dL (ref 8.4–10.5)
Chloride: 103 meq/L (ref 96–112)
Creatinine, Ser: 1 mg/dL (ref 0.40–1.50)
GFR: 101.56 mL/min (ref >60.00)
Glucose, Bld: 95 mg/dL (ref 70–99)
Potassium: 4.9 meq/L (ref 3.5–5.1)
Sodium: 140 meq/L (ref 135–145)

## 2024-10-03 LAB — MICROALBUMIN / CREATININE URINE RATIO
Creatinine,U: 53.3 mg/dL
Microalb Creat Ratio: UNDETERMINED mg/g (ref 0.0–30.0)
Microalb, Ur: <0.7 mg/dL

## 2024-10-03 LAB — TSH: TSH: 0.97 u[IU]/mL (ref 0.35–5.50)

## 2024-10-03 LAB — HEMOGLOBIN A1C: Hgb A1c MFr Bld: 5.2 % (ref 4.6–6.5)

## 2024-10-03 NOTE — Assessment & Plan Note (Signed)
 Anxiety is situational with panic attacks. Propranolol  is effective for situational anxiety. Discussed potential use of SSRIs if anxiety worsens. Continue propranolol  10 mg as needed. Consider SSRIs if anxiety becomes more frequent or severe.

## 2024-10-03 NOTE — Progress Notes (Signed)
 Established Patient Office Visit  Patient ID: Tristan Poole, male    DOB: Jan 01, 1996  Age: 28 y.o. MRN: [id number] PCP: Jerrell Tristan Ned, MD  Chief Complaint  Patient presents with   Hypertension    Seen in UC for HTN and throat issues/neck issues  Would like to have blood work done     Subjective:     HPI  Discussed the use of AI scribe software for clinical note transcription with the patient, who gave verbal consent to proceed.  History of Present Illness Tristan Poole is a 28 year old male who presents with neck pain and elevated blood pressure.  Approximately a week and a half ago, he began experiencing dull neck pain to the right of his Adam's apple, noticeable with movement, talking, coughing, or sneezing. The pain persisted for a few days before he visited urgent care, where no significant findings were noted, and it was determined not to be related to his thyroid. Ibuprofen alleviated the pain, and as of yesterday, the pain has mostly resolved. At one point, the pain radiated to his jaw, but he did not experience a sore throat.  He has been monitoring his blood pressure at home, with readings around 150/75 to 150/80, and the highest recorded at 172/110 at urgent care. He has a history of being on a beta blocker, which he has since stopped. He maintains a strict diet and exercises regularly. There is a family history of hypertension, with his mother and possibly his father having high blood pressure. His maternal great-grandfather and grandfather died of strokes related to high blood pressure.  He experiences situational anxiety, particularly related to health concerns and flying, which has led to panic attacks in the past. He uses propranolol  10 mg as needed for these situations. He has a history of anxiety related to heart palpitations, which has improved.  He is currently taking finasteride 1 mg for hair transplant and pantoprazole for reflux. He reports a  sensation of something in his throat when making certain movements, but not during normal swallowing.  He works in orthoptist and as a proofreader. He has a two-year-old child and another baby on the way. He reports low stress levels currently due to a new job within the same company.    Objective:     BP (!) 169/96   Pulse 78   Wt 197 lb (89.4 kg)   SpO2 100%   BMI 26.72 kg/m    Physical Exam  Gen: Well-appearing man Mouth: No oral lesions Neck: Normal thyroid, no nodules or adenopathy, no tenderness in the area of his concern next to the right cricoid cartilage Heart: Regular, no murmur Lungs: Unlabored, clear throughout Ext: Warm, no edema    Assessment & Plan:   Problem List Items Addressed This Visit       High   Tachycardia, paroxysmal (HCC) (Chronic)   Chronic issue and much improved from when I last saw him.  Tachycardia episodes have been much less frequent, not using propranolol  much these days.  Reduced stress with the change in his job seems to have helped a lot.        Medium    Anxiety disorder (Chronic)   Anxiety is situational with panic attacks. Propranolol  is effective for situational anxiety. Discussed potential use of SSRIs if anxiety worsens. Continue propranolol  10 mg as needed. Consider SSRIs if anxiety becomes more frequent or severe.      Relevant Orders   TSH  Essential hypertension - Primary (Chronic)   Blood pressure remains elevated and propranolol  is ineffective. Discussed long-term risks of untreated hypertension. Ordered blood work to assess kidney function, aldosterone levels, and urine protein. Will discuss initiation of antihypertensive medication based on blood work results.      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   Continues to use finasteride 1 mg daily.  Has the option of using topical minoxidil as well.      Other Visit Diagnoses       Screening for lipid disorders       Relevant Orders   Lipid panel     Screening for diabetes mellitus       Relevant Orders   Hemoglobin A1c        Return in about 3 months (around 01/01/2025).    Tristan Debby Specking, MD  [facility]

## 2024-10-03 NOTE — Assessment & Plan Note (Signed)
 Excellent results from a hair transplant completed in [facility] around July.  Continues to use finasteride 1 mg daily.  Has the option of using topical minoxidil as well.

## 2024-10-03 NOTE — Patient Instructions (Signed)
  VISIT SUMMARY: Today, you were seen for neck pain and elevated blood pressure. We discussed your recent experiences with neck pain, which has mostly resolved, and your ongoing concerns about high blood pressure. We also reviewed your history of anxiety and panic attacks.  YOUR PLAN: -ESSENTIAL HYPERTENSION: Essential hypertension means that your blood pressure is consistently higher than normal. We talked about the long-term risks of untreated high blood pressure and ordered blood work to check your kidney function, aldosterone levels, and urine protein. Based on the results, we will discuss starting medication to help control your blood pressure.  -ANXIETY DISORDER WITH PANIC ATTACKS: Anxiety disorder with panic attacks means you experience sudden episodes of intense fear or discomfort. Your anxiety is situational, and propranolol  has been effective for you. We discussed the possibility of using SSRIs if your anxiety worsens. For now, continue taking propranolol  10 mg as needed.  -GENERAL HEALTH MAINTENANCE: We emphasized the importance of regular blood work to monitor your overall health and any issues related to hypertension. We have ordered blood work to help us  keep track of your health and identify any underlying problems.  INSTRUCTIONS: Please complete the blood work as soon as possible so we can determine the best course of action for your blood pressure. Continue taking propranolol  10 mg as needed for anxiety. If your anxiety becomes more frequent or severe, we may consider starting you on SSRIs. Follow up with us  after your blood work is done to discuss the results and next steps.

## 2024-10-03 NOTE — Assessment & Plan Note (Signed)
 Blood pressure remains elevated and propranolol  is ineffective. Discussed long-term risks of untreated hypertension. Ordered blood work to assess kidney function, aldosterone levels, and urine protein. Will discuss initiation of antihypertensive medication based on blood work results.

## 2024-10-03 NOTE — Assessment & Plan Note (Signed)
 Chronic issue and much improved from when I last saw him.  Tachycardia episodes have been much less frequent, not using propranolol  much these days.  Reduced stress with the change in his job seems to have helped a lot.

## 2024-10-04 ENCOUNTER — Ambulatory Visit: Payer: Self-pay | Admitting: Student in an Organized Health Care Education/Training Program

## 2024-10-08 LAB — ALDOSTERONE + RENIN ACTIVITY W/ RATIO
ALDO / PRA Ratio: 2.7 ratio (ref 0.9–28.9)
Aldosterone: 3 ng/dL
Renin Activity: 1.12 ng/mL/h (ref 0.25–5.82)

## 2024-12-04 ENCOUNTER — Other Ambulatory Visit: Payer: Self-pay | Admitting: Student in an Organized Health Care Education/Training Program

## 2025-01-01 ENCOUNTER — Ambulatory Visit: Admitting: Student in an Organized Health Care Education/Training Program
